# Patient Record
Sex: Male | Born: 2000 | Race: Black or African American | Hispanic: No | Marital: Single | Smoking: Never smoker
Health system: Southern US, Community
[De-identification: ages and names within clinical notes are randomized; demographics above are authoritative.]

---
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
## 2020-02-11 ENCOUNTER — Encounter (HOSPITAL_COMMUNITY): Payer: Self-pay

## 2020-02-11 ENCOUNTER — Ambulatory Visit (HOSPITAL_COMMUNITY)
Admission: EM | Admit: 2020-02-11 | Discharge: 2020-02-11 | Disposition: A | Discharge status: Home / Self Care | Payer: Medicaid Other | Attending: Emergency Medicine | Admitting: Emergency Medicine

## 2020-02-11 ENCOUNTER — Other Ambulatory Visit: Payer: Self-pay

## 2020-02-11 DIAGNOSIS — Z20822 Contact with and (suspected) exposure to covid-19: Secondary | ICD-10-CM | POA: Insufficient documentation

## 2020-02-11 DIAGNOSIS — J029 Acute pharyngitis, unspecified: Secondary | ICD-10-CM | POA: Diagnosis not present

## 2020-02-11 DIAGNOSIS — R05 Cough: Secondary | ICD-10-CM | POA: Insufficient documentation

## 2020-02-11 DIAGNOSIS — J069 Acute upper respiratory infection, unspecified: Secondary | ICD-10-CM | POA: Diagnosis present

## 2020-02-11 MED ORDER — DM-GUAIFENESIN ER 30-600 MG PO TB12: 1.0000 | ORAL_TABLET | Freq: Two times a day (BID) | ORAL | 0 refills | Status: AC

## 2020-02-11 MED ORDER — BENZONATATE 200 MG PO CAPS: 200.0000 mg | ORAL_CAPSULE | Freq: Three times a day (TID) | ORAL | 0 refills | Status: AC | PRN

## 2020-02-11 NOTE — Discharge Instructions (Signed)
COVID test pending, monitor MyChart for results Tessalon every 8 hours for cough Mucinex DM twice daily for cough/congestion Rest and fluids Tylenol and ibuprofen as needed for headaches Follow up if not improving

## 2020-02-11 NOTE — ED Triage Notes (Signed)
Pt presents to UC for nasal congestion and cough x4 days. Pt denies fevers, chills, sore throat, n/v/d, loss of taste or smell, HA, or body aches. Pt does not want covid testing at this time.   Pt has been treating with day/night otc allergy meds, and nasal spray with out relief.

## 2020-02-12 LAB — SARS CORONAVIRUS 2 (TAT 6-24 HRS): SARS Coronavirus 2: NEGATIVE

## 2020-02-12 NOTE — ED Provider Notes (Signed)
[facility]    CSN: [id number] Arrival date & time: 02/11/20  1303      History   Chief Complaint Chief Complaint  Patient presents with  . Nasal Congestion    HPI Derrick Griffin is a 19 y.o. male presenting today for evaluation of URI symptoms.  Patient has had congestion and cough for 4 days.  Has had a sore throat that is mainly associated with cough.  Denies any fevers chills body aches.  Has been using over-the-counter DayQuil and NyQuil.  Denies chest pain or shortness of breath.  HPI  History reviewed. No pertinent past medical history.  There are no problems to display for this patient.   History reviewed. No pertinent surgical history.     Home Medications    Prior to Admission medications   Medication Sig Start Date End Date Taking? Authorizing Provider  benzonatate (TESSALON) 200 MG capsule Take 1 capsule (200 mg total) by mouth 3 (three) times daily as needed for up to 7 days for cough. 02/11/20 02/18/20  Tymon Nemetz C, PA-C  dextromethorphan-guaiFENesin (MUCINEX DM) 30-600 MG 12hr tablet Take 1 tablet by mouth 2 (two) times daily. 02/11/20   Ernestine Rohman, Junius Creamer, PA-C  Pediatric Multivit-Minerals-C (FLINTSTONES GUMMIES PO) Take 1 tablet by mouth daily.    [provider]    Family History History reviewed. No pertinent family history.  Social History Social History   Tobacco Use  . Smoking status: Never Smoker  . Smokeless tobacco: Never Used  Substance Use Topics  . Alcohol use: Not on file  . Drug use: Not on file     Allergies   Patient has no known allergies.   Review of Systems Review of Systems  Constitutional: Negative for activity change, appetite change, chills, fatigue and fever.  HENT: Positive for congestion, rhinorrhea and sore throat. Negative for ear pain, sinus pressure and trouble swallowing.   Eyes: Negative for discharge and redness.  Respiratory: Positive for cough. Negative for chest tightness and  shortness of breath.   Cardiovascular: Negative for chest pain.  Gastrointestinal: Negative for abdominal pain, diarrhea, nausea and vomiting.  Musculoskeletal: Negative for myalgias.  Skin: Negative for rash.  Neurological: Negative for dizziness, light-headedness and headaches.     Physical Exam Triage Vital Signs ED Triage Vitals  Enc Vitals Group     BP 02/11/20 1418 (!) 128/58     Pulse Rate 02/11/20 1418 72     Resp 02/11/20 1418 16     Temp 02/11/20 1418 98.4 F (36.9 C)     Temp Source 02/11/20 1418 Oral     SpO2 02/11/20 1418 99 %     Weight --      Height --      Head Circumference --      Peak Flow --      Pain Score 02/11/20 1421 0     Pain Loc --      Pain Edu? --      Excl. in GC? --    No data found.  Updated Vital Signs BP (!) 128/58 (BP Location: Right Arm)   Pulse 72   Temp 98.4 F (36.9 C) (Oral)   Resp 16   SpO2 99%   Visual Acuity Right Eye Distance:   Left Eye Distance:   Bilateral Distance:    Right Eye Near:   Left Eye Near:    Bilateral Near:     Physical Exam Vitals and nursing note reviewed.  Constitutional:  Appearance: He is well-developed.     Comments: No acute distress  HENT:     Head: Normocephalic and atraumatic.     Ears:     Comments: Bilateral ears without tenderness to palpation of external auricle, tragus and mastoid, EAC's without erythema or swelling, TM's with good bony landmarks and cone of light. Non erythematous.     Nose: Nose normal.     Mouth/Throat:     Comments: Oral mucosa pink and moist, no tonsillar enlargement or exudate. Posterior pharynx patent and nonerythematous, no uvula deviation or swelling. Normal phonation.  Eyes:     Conjunctiva/sclera: Conjunctivae normal.  Cardiovascular:     Rate and Rhythm: Normal rate.  Pulmonary:     Effort: Pulmonary effort is normal. No respiratory distress.     Comments: Breathing comfortably at rest, CTABL, no wheezing, rales or other adventitious sounds  auscultated Abdominal:     General: There is no distension.  Musculoskeletal:        General: Normal range of motion.     Cervical back: Neck supple.  Skin:    General: Skin is warm and dry.  Neurological:     Mental Status: He is alert and oriented to person, place, and time.      UC Treatments / Results  Labs (all labs ordered are listed, but only abnormal results are displayed) Labs Reviewed  SARS CORONAVIRUS 2 (TAT 6-24 HRS)    EKG   Radiology No results found.  Procedures Procedures (including critical care time)  Medications Ordered in UC Medications - No data to display  Initial Impression / Assessment and Plan / UC Course  I have reviewed the triage vital signs and the nursing notes.  Pertinent labs & imaging results that were available during my care of the patient were reviewed by me and considered in my medical decision making (see chart for details).     Covid test pending, URI symptoms x5 days, suspect still likely viral respiratory infection and recommending continued symptomatic and supportive care, lungs clear, vital signs stable.  Rest and fluids.Discussed strict return precautions. Patient verbalized understanding and is agreeable with plan.  Final Clinical Impressions(s) / UC Diagnoses   Final diagnoses:  Viral URI with cough     Discharge Instructions     COVID test pending, monitor MyChart for results Tessalon every 8 hours for cough Mucinex DM twice daily for cough/congestion Rest and fluids Tylenol and ibuprofen as needed for headaches Follow up if not improving   ED Prescriptions    Medication Sig Dispense Auth. Provider   benzonatate (TESSALON) 200 MG capsule Take 1 capsule (200 mg total) by mouth 3 (three) times daily as needed for up to 7 days for cough. 28 capsule Jai Bear C, PA-C   dextromethorphan-guaiFENesin (MUCINEX DM) 30-600 MG 12hr tablet Take 1 tablet by mouth 2 (two) times daily. 20 tablet Maleya Leever, [place] C,  PA-C     PDMP not reviewed this encounter.   Lew Dawes, [place] 02/12/20 1538

## 2020-04-11 IMAGING — DX DG FINGER MIDDLE 2+V*R*
1 series · 3 of 3 positions shown · non-contrast
Comparison: None.

CLINICAL DATA: Laceration to the tip of right middle finger on
glass.

EXAM:
RIGHT MIDDLE FINGER 2+V

[Series 1: finger · 0.14mm/px · 3 of 3 slices shown]
[im 1/3]
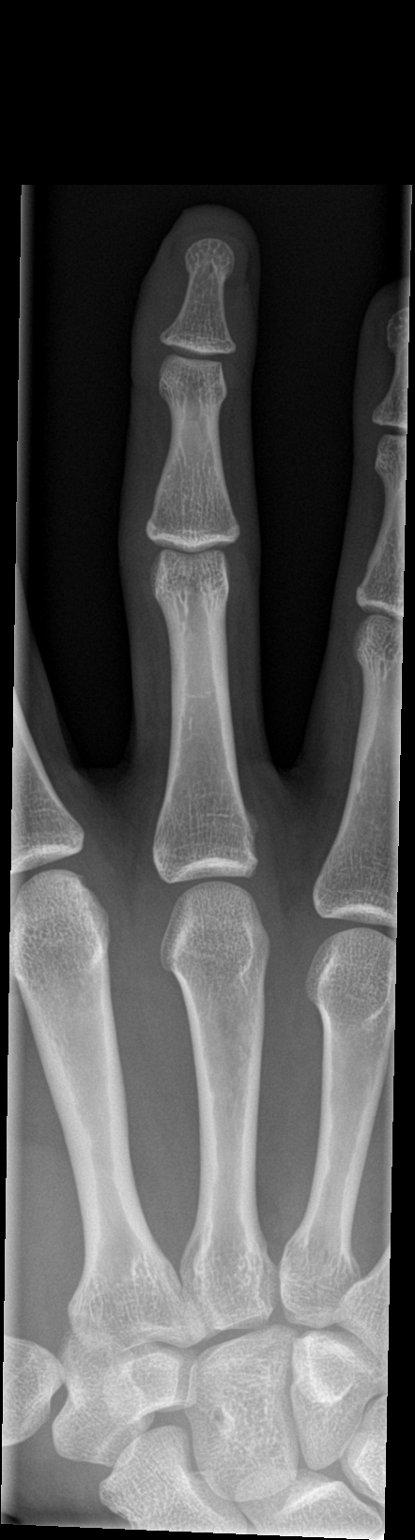
[im 2/3]
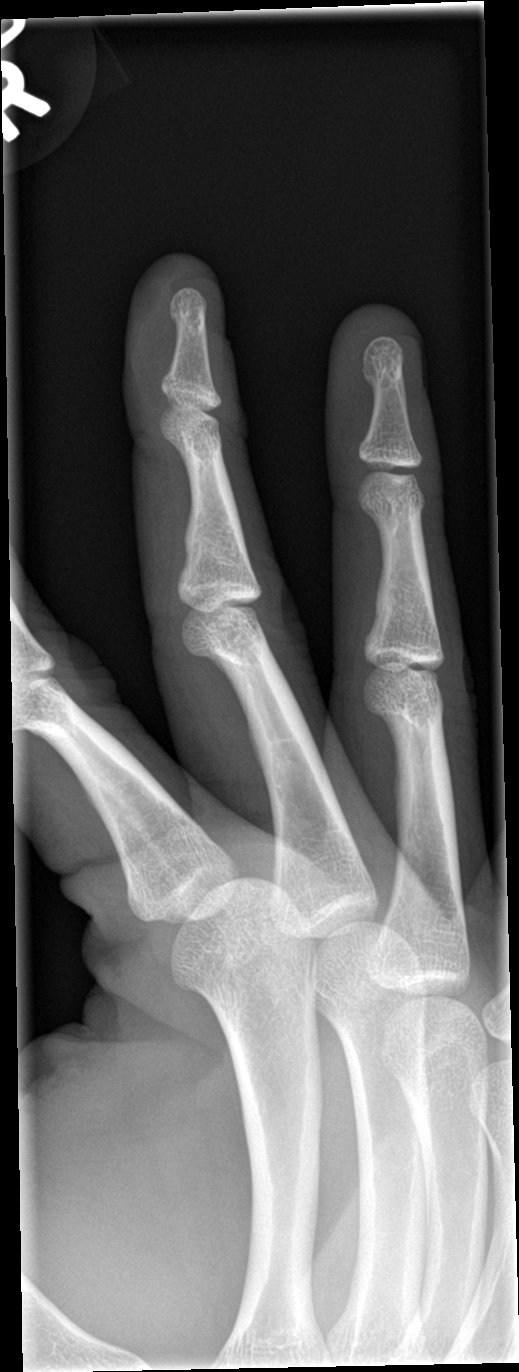
[im 3/3]
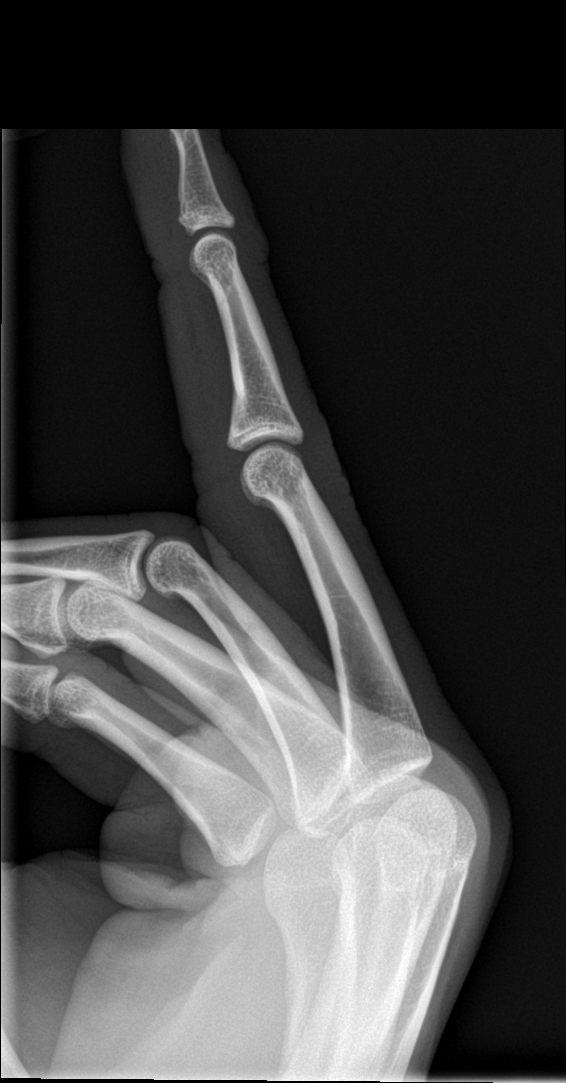

[3 of 3 positions shown; findings below may reference images not displayed]

FINDINGS: There is no evidence of fracture or dislocation. There is no
evidence of arthropathy or other focal bone abnormality. No
radiopaque foreign body.
IMPRESSION: No fracture or foreign body.

## 2024-06-07 ENCOUNTER — Other Ambulatory Visit: Payer: Self-pay

## 2024-06-07 ENCOUNTER — Encounter (HOSPITAL_BASED_OUTPATIENT_CLINIC_OR_DEPARTMENT_OTHER): Payer: Self-pay | Admitting: Emergency Medicine

## 2024-06-07 ENCOUNTER — Emergency Department (HOSPITAL_BASED_OUTPATIENT_CLINIC_OR_DEPARTMENT_OTHER)
Admission: EM | Admit: 2024-06-07 | Discharge: 2024-06-07 | Disposition: A | Discharge status: Home / Self Care | Payer: Self-pay | Attending: Emergency Medicine | Admitting: Emergency Medicine

## 2024-06-07 DIAGNOSIS — T148XXA Other injury of unspecified body region, initial encounter: Secondary | ICD-10-CM

## 2024-06-07 MED ORDER — LIDOCAINE-EPINEPHRINE-TETRACAINE (LET) TOPICAL GEL
3.0000 mL | Freq: Once | TOPICAL | Status: DC
  Filled 2024-06-07: qty 3

## 2024-06-07 NOTE — ED Triage Notes (Addendum)
 Small scratch/lac to R hand yesterday at work. No bleeding. Reports area is stinging

## 2024-06-07 NOTE — Discharge Instructions (Signed)
 Monitor for signs/symptoms of wound infection, like worsening redness/swelling/warmth/drainage at the site of your wound.  If you experience any of the symptoms please return to the emergency department.

## 2024-06-07 NOTE — ED Provider Notes (Signed)
 [place] EMERGENCY DEPARTMENT AT [facility] Provider Note   CSN: [id number] Arrival date & time: 06/07/24  9059     Patient presents with: Laceration   Derrick Griffin is a 23 y.o. male.   23 year old male presenting with laceration.  Patient was using a box cutter at work yesterday when he cut his right hand, he cleaned the area thoroughly with soap and water afterwards, he is unsure if he is up-to-date on his tetanus.  He notes stinging at the site of the cut, he has full movement of his hand and no numbness/tingling around or distal to the site of the wound.  No other complaints or injuries to report.   Laceration      Prior to Admission medications   Medication Sig Start Date End Date Taking? Authorizing Provider  dextromethorphan-guaiFENesin  (MUCINEX  DM) 30-600 MG 12hr tablet Take 1 tablet by mouth 2 (two) times daily. 02/11/20   Wieters, Hallie C, PA-C  Pediatric Multivit-Minerals-C (FLINTSTONES GUMMIES PO) Take 1 tablet by mouth daily.    [provider]    Allergies: Patient has no known allergies.    Review of Systems  Updated Vital Signs  Vitals:   06/07/24 0945  BP: (!) 154/70  Pulse: 78  Resp: 18  Temp: 98.5 F (36.9 C)  TempSrc: Oral  SpO2: 99%     Physical Exam Vitals and nursing note reviewed.  HENT:     Head: Normocephalic.  Eyes:     Extraocular Movements: Extraocular movements intact.  Cardiovascular:     Rate and Rhythm: Normal rate.  Pulmonary:     Effort: Pulmonary effort is normal.  Musculoskeletal:     Cervical back: Normal range of motion.     Comments: R hand: Grip strength intact, no sensory deficit, normal capillary refill  Skin:    General: Skin is warm and dry.     Comments: Abrasion to palmar aspect of R hand, not actively bleeding, no spreading/streaking erythema, see photo  Neurological:     Mental Status: He is alert and oriented to person, place, and time.     (all labs ordered are listed, but  only abnormal results are displayed) Labs Reviewed - No data to display  EKG: None  Radiology: No results found.   Procedures   Medications Ordered in the ED  lidocaine -EPINEPHrine -tetracaine  (LET) topical gel (has no administration in time range)                                    Medical Decision Making This patient presents to the ED for concern of abrasion, this involves an extensive number of treatment options, and is a complaint that carries with it a high risk of complications and morbidity.  The differential diagnosis includes abrasion, laceration, laceration with foreign body, laceration with tendinous/ligamentous injury, laceration with bony involvement   Cardiac Monitoring: / EKG:  The patient was maintained on a cardiac monitor.  I personally viewed and interpreted the cardiac monitored which showed an underlying rhythm of: NSR  Problem List / ED Course / Critical interventions / Medication management  I ordered medication including LET  for stinging over abrasion   Test / Admission - Considered:  Physical exam notable for superficial abrasion to the palmar aspect of the right hand, see photo above.  No evidence of spreading/streaking erythema, I have no suspicion for a wound infection at this time.  Patient is  up-to-date on his Tdap as of 2020, no need to update this today.  Patient given LET in the emergency department to apply over abrasion, I advised him that the stinging he is experiencing is normal and should improve as the wound continues to heal.  He voiced understanding, he understands if he experiences any signs/symptoms consistent with a wound infection he needs to return to the emergency department.  He is appropriate for discharge at this time.          Final diagnoses:  Abrasion    ED Discharge Orders     None          Glendia Rocky SAILOR, [place] 06/07/24 1039    Rogelia Jerilynn RAMAN, MD 06/13/24 [phone]
